# Patient Record
Sex: Female | Born: 1975
Health system: Southern US, Community
[De-identification: ages and names within clinical notes are randomized; demographics above are authoritative.]

---

## 1998-05-10 ENCOUNTER — Encounter: Payer: Self-pay | Admitting: Preventative Medicine

## 1998-05-10 ENCOUNTER — Ambulatory Visit (HOSPITAL_COMMUNITY): Admission: RE | Admit: 1998-05-10 | Discharge: 1998-05-10 | Payer: Self-pay | Admitting: Preventative Medicine

## 1998-05-24 ENCOUNTER — Ambulatory Visit (HOSPITAL_COMMUNITY): Admission: RE | Admit: 1998-05-24 | Discharge: 1998-05-24 | Payer: Self-pay | Admitting: Preventative Medicine

## 1998-05-24 ENCOUNTER — Encounter: Payer: Self-pay | Admitting: Preventative Medicine

## 1998-06-19 ENCOUNTER — Ambulatory Visit (HOSPITAL_COMMUNITY): Admission: RE | Admit: 1998-06-19 | Discharge: 1998-06-19 | Payer: Self-pay | Admitting: Preventative Medicine

## 1998-06-19 ENCOUNTER — Encounter: Payer: Self-pay | Admitting: Preventative Medicine

## 2004-12-28 ENCOUNTER — Emergency Department (HOSPITAL_COMMUNITY): Admission: EM | Admit: 2004-12-28 | Discharge: 2004-12-28 | Payer: Self-pay | Admitting: Emergency Medicine

## 2005-08-14 ENCOUNTER — Ambulatory Visit: Payer: Self-pay | Admitting: Gastroenterology

## 2006-08-28 ENCOUNTER — Ambulatory Visit (HOSPITAL_COMMUNITY): Admission: RE | Admit: 2006-08-28 | Discharge: 2006-08-28 | Payer: Self-pay | Admitting: Internal Medicine

## 2008-06-03 ENCOUNTER — Ambulatory Visit (HOSPITAL_COMMUNITY): Admission: RE | Admit: 2008-06-03 | Discharge: 2008-06-03 | Payer: Self-pay | Admitting: Family Medicine

## 2010-06-01 NOTE — Consult Note (Signed)
NAME:  Valerie Lowe, Valerie Lowe             ACCOUNT NO.:  [id number]   MEDICAL RECORD NO.:  [id number]           PATIENT TYPE:   LOCATION:                                 FACILITY:   PHYSICIAN:  Kassie Mends, M.D.      DATE OF BIRTH:  11/30/1975   DATE OF CONSULTATION:  DATE OF DISCHARGE:                                   CONSULTATION   REQUESTING PHYSICIAN:  Dr. Phillips Odor   REASON FOR CONSULTATION:  Refractory GERD, chronic nausea, dyspepsia.   HISTORY OF PRESENT ILLNESS:  Valerie Lowe is a 35 year old African-American  female.  She states approximately 11 months ago she was started on  prednisone for a rash.  She began to develop severe odynophagia as well as  chronic nausea, heartburn, indigestion.  The prednisone was then  discontinued.  She was started on PPI.  She continued to complain of  grinding sensation to her epigastrium and chest.  She continues to  complain of almost daily nausea without any vomiting.  She has occasional  intermittent dysphagia to solid foods.  She complains of almost daily  indigestion.  She denies any anorexia or early satiety.  She has been on  Aciphex 20 mg daily which has helped some, but not completely.  She  continues to have bowel movements on a daily basis.  She has noticed some  dark stools.   PAST MEDICAL HISTORY:  Tubal pregnancy with her right tube removed.   CURRENT MEDICATIONS:  1. Aciphex 20 mg daily.  2. Biotin once daily.  3. Multivitamin once daily.   ALLERGIES:  No known drug allergies.   FAMILY HISTORY:  There is no known family history of colorectal carcinoma,  liver or chronic GI problems.  Mother age 72, has history of hypertension.  Father age 56, is in good health.  She has three healthy siblings.   SOCIAL HISTORY:  Ms. Savitt has been married for nine years.  She has two  healthy children.  She works for [employer] in [place].  She denies  any tobacco, alcohol, or drug use.   REVIEW OF SYSTEMS:  CONSTITUTIONAL:  Weight  is stable.  She does complain of  some fatigue.  CARDIOVASCULAR:  Denies chest pain or palpitations.  PULMONARY:  Denies shortness of breath, dyspnea, cough, hemoptysis.  GI:  See HPI.   PHYSICAL EXAMINATION:  VITAL SIGNS:  Weight 135.5 pounds, height 67 inches,  temperature 98.8, blood pressure 110/62, and pulse 88.  GENERAL:  Valerie Lowe is a 35 year old African-American female who is alert,  oriented, pleasant, cooperative, in no acute distress.HEENT:  Sclerae clear,  non-icteric.  Conjunctivae pink.  Oropharynx pink and moist without any  lesions.NECK:  Supple without any mass, thyromegaly.CHEST:  Regular rate and  rhythm.  Normal S1, S2 without murmurs, rubs, clicks, or gallops.LUNGS:  Clear to auscultation bilaterally.ABDOMEN:  Positive bowel sounds x4.  No  bruits auscultated, soft, nondistended.  She does have mild tenderness to  the epigastrium and no Murphy's point tenderness, rebound tenderness, or  guarding.  No hepatosplenomegaly or mass. RECTAL:  Deferred.EXTREMITIES:  Without clubbing or  edema bilaterally.   IMPRESSION:  Valerie Lowe is a 35 year old African-American female with an 63-  month history of refractory gastroesophageal reflux disease symptoms  including daily heartburn, indigestion.  She has noticed odynophagia and  constant upper abdominal discomfort and a grinding type chest pain.  She  also has chronic nausea.  Her symptoms are going to need to be further  evaluated with EGD to rule out erosive esophagitis/gastritis.  Other  possibilities include gallbladder etiology which can be looked into further  if EGD is negative.   PLAN:  EGD with Dr. Cira Servant in the near future.  I have discussed the  procedure including risks and benefits which include, but not limited to,  bleeding, infection, perforation, drug reaction.  She agrees with plan.  She  is going to continue Aciphex 20 mg daily for now with further  recommendations pending procedure.   I would like to  thank Dr. Phillips Odor for allowing [place] to participate in the care  of Valerie Lowe.      Nicholas Lose, N.P.      Kassie Mends, M.D.  Electronically Signed    [facility]  D:  08/14/2005  T:  08/14/2005  Job:  [id number]   cc:   Corrie Mckusick, M.D.  Fax: [phone]

## 2012-02-18 ENCOUNTER — Other Ambulatory Visit (HOSPITAL_COMMUNITY): Payer: Self-pay | Admitting: Family Medicine

## 2012-02-18 DIAGNOSIS — Z139 Encounter for screening, unspecified: Secondary | ICD-10-CM

## 2012-02-24 ENCOUNTER — Ambulatory Visit (HOSPITAL_COMMUNITY): Payer: Self-pay

## 2012-02-28 ENCOUNTER — Ambulatory Visit (HOSPITAL_COMMUNITY): Payer: Self-pay

## 2013-10-15 ENCOUNTER — Other Ambulatory Visit (HOSPITAL_COMMUNITY): Payer: Self-pay | Admitting: Family Medicine

## 2013-10-15 DIAGNOSIS — Z0000 Encounter for general adult medical examination without abnormal findings: Secondary | ICD-10-CM

## 2013-10-25 ENCOUNTER — Ambulatory Visit (HOSPITAL_COMMUNITY): Payer: Self-pay

## 2013-10-25 ENCOUNTER — Ambulatory Visit (HOSPITAL_COMMUNITY)
Admission: RE | Admit: 2013-10-25 | Discharge: 2013-10-25 | Disposition: A | Discharge status: Home / Self Care | Payer: Private Health Insurance - Indemnity | Source: Ambulatory Visit | Attending: Family Medicine | Admitting: Family Medicine

## 2013-10-25 DIAGNOSIS — Z1231 Encounter for screening mammogram for malignant neoplasm of breast: Secondary | ICD-10-CM | POA: Diagnosis not present

## 2013-10-25 DIAGNOSIS — Z0000 Encounter for general adult medical examination without abnormal findings: Secondary | ICD-10-CM

## 2014-08-03 ENCOUNTER — Other Ambulatory Visit (HOSPITAL_COMMUNITY): Payer: Self-pay | Admitting: Family Medicine

## 2014-08-03 DIAGNOSIS — Z1231 Encounter for screening mammogram for malignant neoplasm of breast: Secondary | ICD-10-CM

## 2014-10-27 ENCOUNTER — Ambulatory Visit (HOSPITAL_COMMUNITY)
Admission: RE | Admit: 2014-10-27 | Discharge: 2014-10-27 | Disposition: A | Discharge status: Home / Self Care | Payer: BLUE CROSS/BLUE SHIELD | Source: Ambulatory Visit | Attending: Family Medicine | Admitting: Family Medicine

## 2014-10-27 DIAGNOSIS — Z1231 Encounter for screening mammogram for malignant neoplasm of breast: Secondary | ICD-10-CM | POA: Diagnosis not present

## 2015-10-17 ENCOUNTER — Other Ambulatory Visit (HOSPITAL_COMMUNITY): Payer: Self-pay | Admitting: Family Medicine

## 2015-10-17 DIAGNOSIS — Z1231 Encounter for screening mammogram for malignant neoplasm of breast: Secondary | ICD-10-CM

## 2015-11-09 ENCOUNTER — Ambulatory Visit (HOSPITAL_COMMUNITY)
Admission: RE | Admit: 2015-11-09 | Discharge: 2015-11-09 | Disposition: A | Discharge status: Home / Self Care | Payer: BLUE CROSS/BLUE SHIELD | Source: Ambulatory Visit | Attending: Family Medicine | Admitting: Family Medicine

## 2015-11-09 DIAGNOSIS — Z1231 Encounter for screening mammogram for malignant neoplasm of breast: Secondary | ICD-10-CM | POA: Diagnosis not present

## 2016-09-13 ENCOUNTER — Other Ambulatory Visit (HOSPITAL_COMMUNITY): Payer: Self-pay | Admitting: Family Medicine

## 2016-09-13 DIAGNOSIS — Z1231 Encounter for screening mammogram for malignant neoplasm of breast: Secondary | ICD-10-CM

## 2016-11-11 ENCOUNTER — Encounter (HOSPITAL_COMMUNITY): Payer: Self-pay

## 2016-11-11 ENCOUNTER — Ambulatory Visit (HOSPITAL_COMMUNITY)
Admission: RE | Admit: 2016-11-11 | Discharge: 2016-11-11 | Disposition: A | Discharge status: Home / Self Care | Payer: 59 | Source: Ambulatory Visit | Attending: Family Medicine | Admitting: Family Medicine

## 2016-11-11 DIAGNOSIS — Z1231 Encounter for screening mammogram for malignant neoplasm of breast: Secondary | ICD-10-CM | POA: Insufficient documentation

## 2017-07-26 DIAGNOSIS — S238XXA Sprain of other specified parts of thorax, initial encounter: Secondary | ICD-10-CM | POA: Diagnosis not present

## 2017-07-26 DIAGNOSIS — S134XXA Sprain of ligaments of cervical spine, initial encounter: Secondary | ICD-10-CM | POA: Diagnosis not present

## 2017-08-06 DIAGNOSIS — Z6826 Body mass index (BMI) 26.0-26.9, adult: Secondary | ICD-10-CM | POA: Diagnosis not present

## 2017-08-06 DIAGNOSIS — E663 Overweight: Secondary | ICD-10-CM | POA: Diagnosis not present

## 2017-08-06 DIAGNOSIS — M545 Low back pain: Secondary | ICD-10-CM | POA: Diagnosis not present

## 2017-10-03 DIAGNOSIS — Z0000 Encounter for general adult medical examination without abnormal findings: Secondary | ICD-10-CM | POA: Diagnosis not present

## 2017-10-27 ENCOUNTER — Other Ambulatory Visit (HOSPITAL_COMMUNITY): Payer: Self-pay | Admitting: Family Medicine

## 2017-10-27 DIAGNOSIS — Z1231 Encounter for screening mammogram for malignant neoplasm of breast: Secondary | ICD-10-CM

## 2017-11-14 ENCOUNTER — Ambulatory Visit (HOSPITAL_COMMUNITY)
Admission: RE | Admit: 2017-11-14 | Discharge: 2017-11-14 | Disposition: A | Discharge status: Home / Self Care | Payer: 59 | Source: Ambulatory Visit | Attending: Family Medicine | Admitting: Family Medicine

## 2017-11-14 DIAGNOSIS — Z1231 Encounter for screening mammogram for malignant neoplasm of breast: Secondary | ICD-10-CM | POA: Diagnosis present

## 2017-12-12 DIAGNOSIS — J209 Acute bronchitis, unspecified: Secondary | ICD-10-CM | POA: Diagnosis not present

## 2020-01-03 ENCOUNTER — Other Ambulatory Visit: Payer: Self-pay

## 2020-01-28 ENCOUNTER — Ambulatory Visit: Payer: 59 | Admitting: Cardiovascular Disease

## 2020-06-20 IMAGING — MG DIGITAL SCREENING BILATERAL MAMMOGRAM WITH TOMO AND CAD
8 series · 9 of 24 positions shown · non-contrast
Comparison: Previous exam(s).

CLINICAL DATA: Screening.

EXAM:
DIGITAL SCREENING BILATERAL MAMMOGRAM WITH TOMO AND CAD

[L MLO synth-2D]
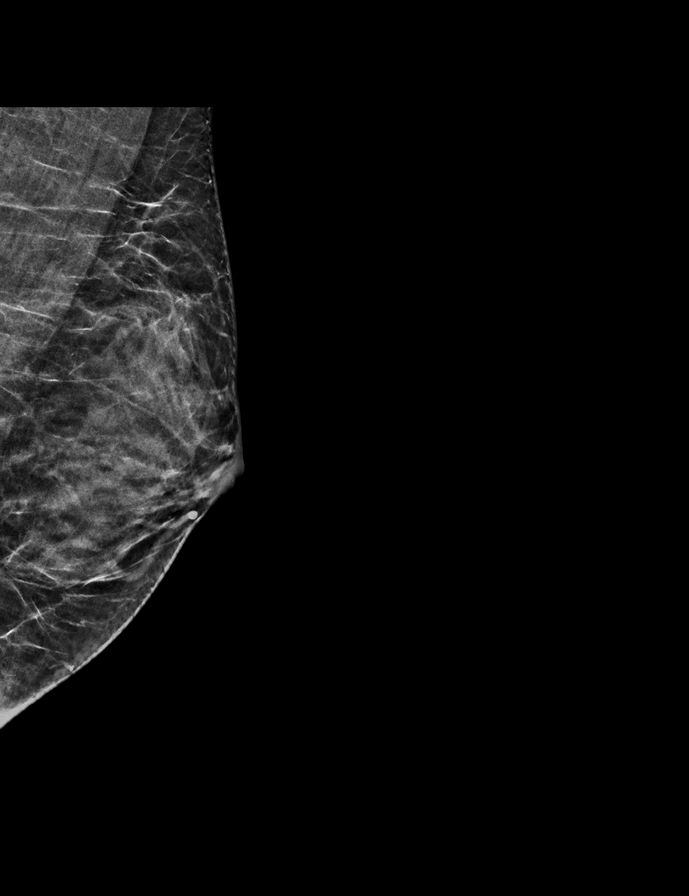

[R MLO synth-2D]
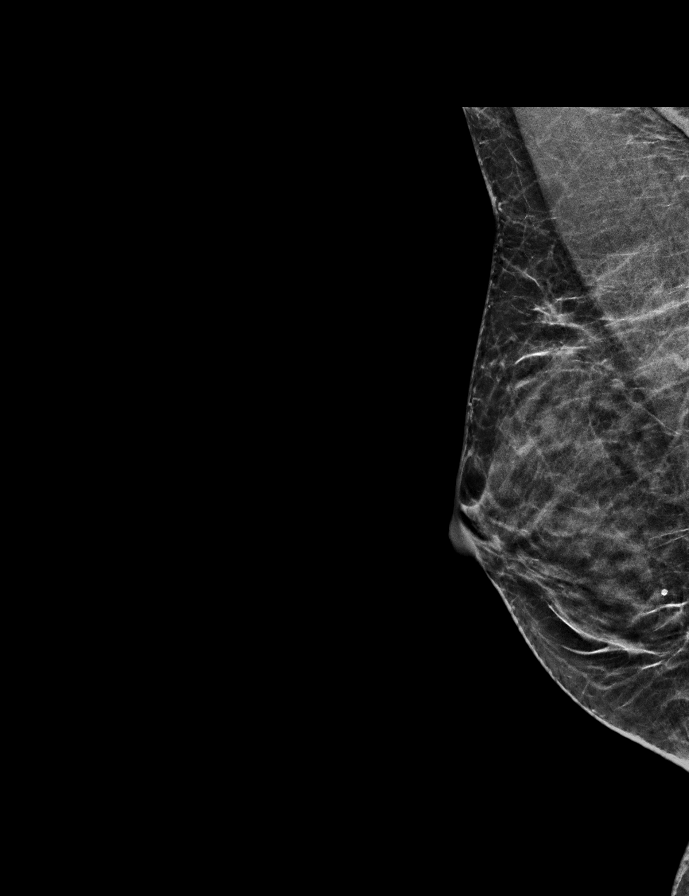

[R CC synth-2D]
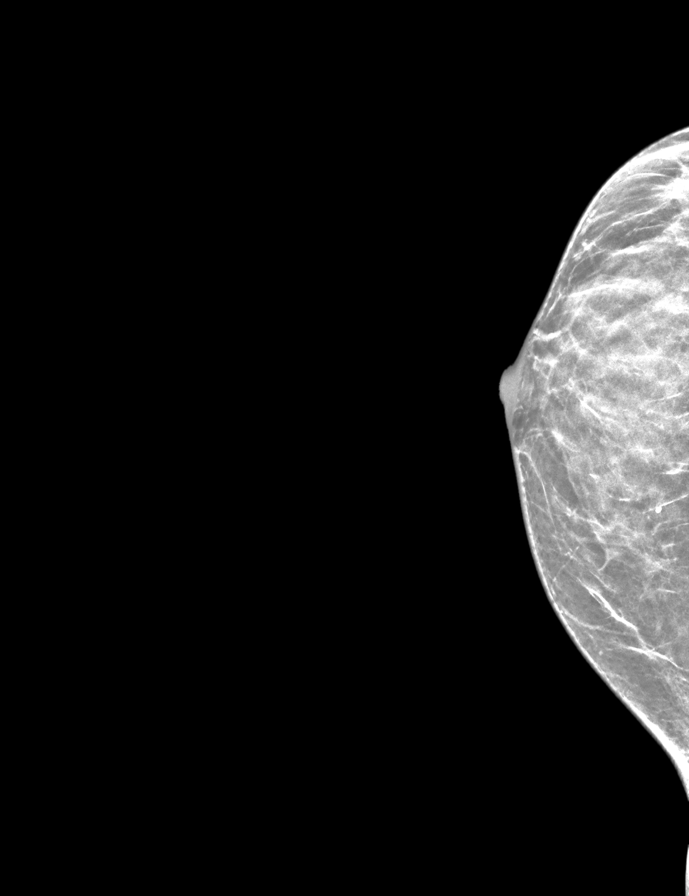

[L CC synth-2D]
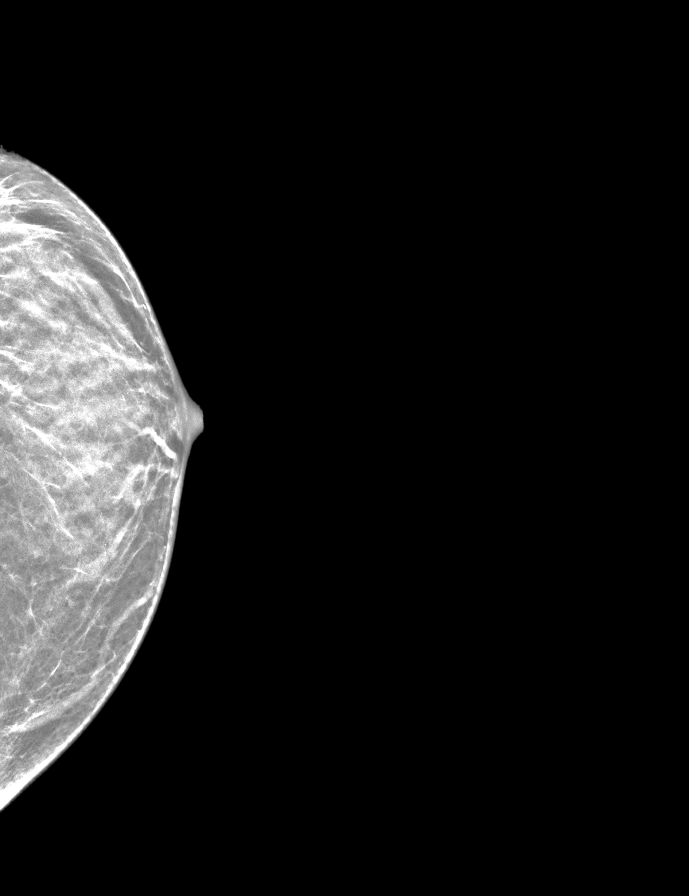

[R CC tomo · 2 of 41 frames shown]
[frame 14/41]
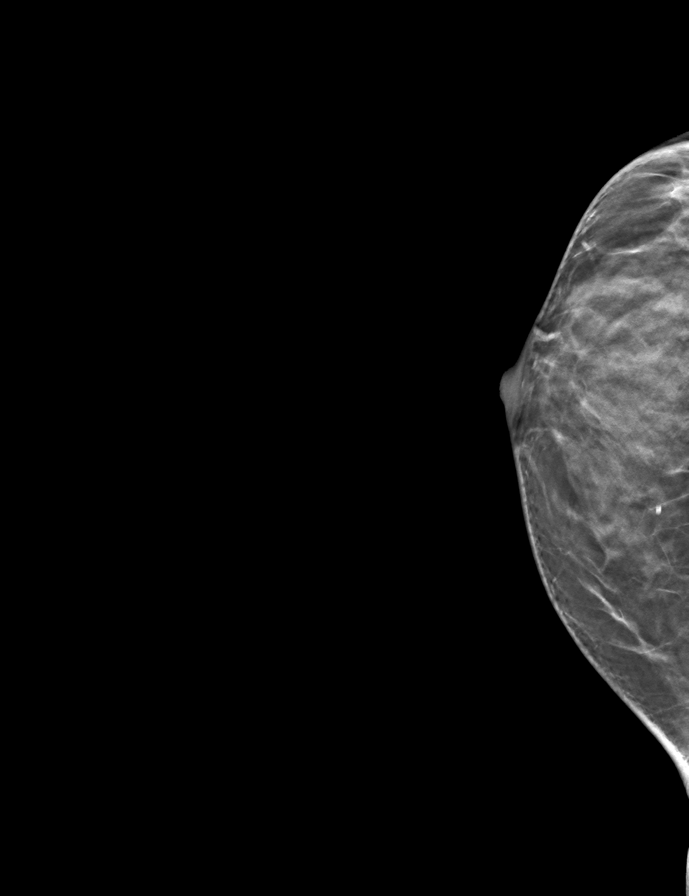
[frame 21/41]
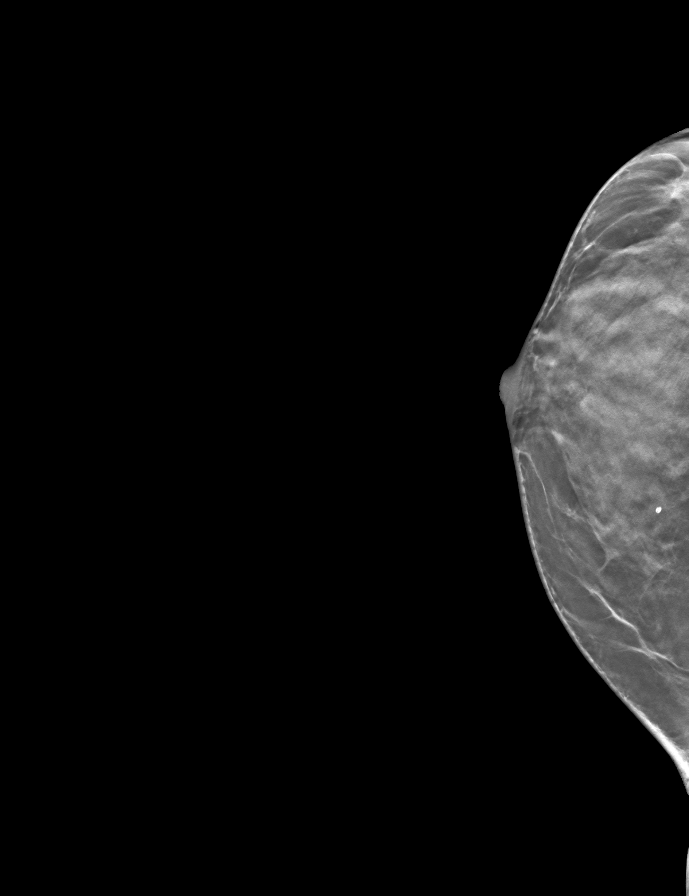

[L MLO tomo · tomo slice 21/42.0]
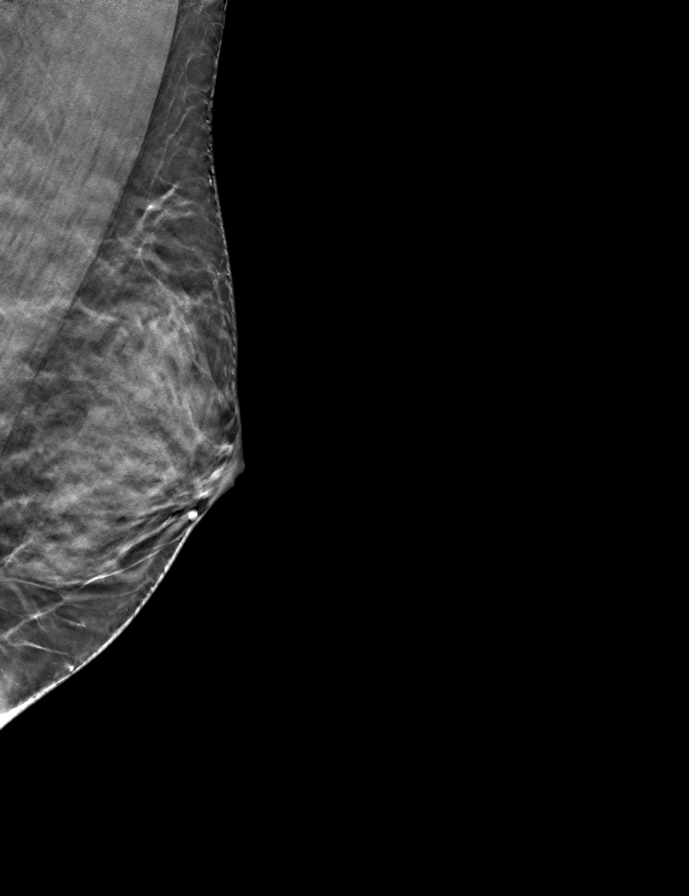

[R MLO tomo · tomo slice 23/44.0]
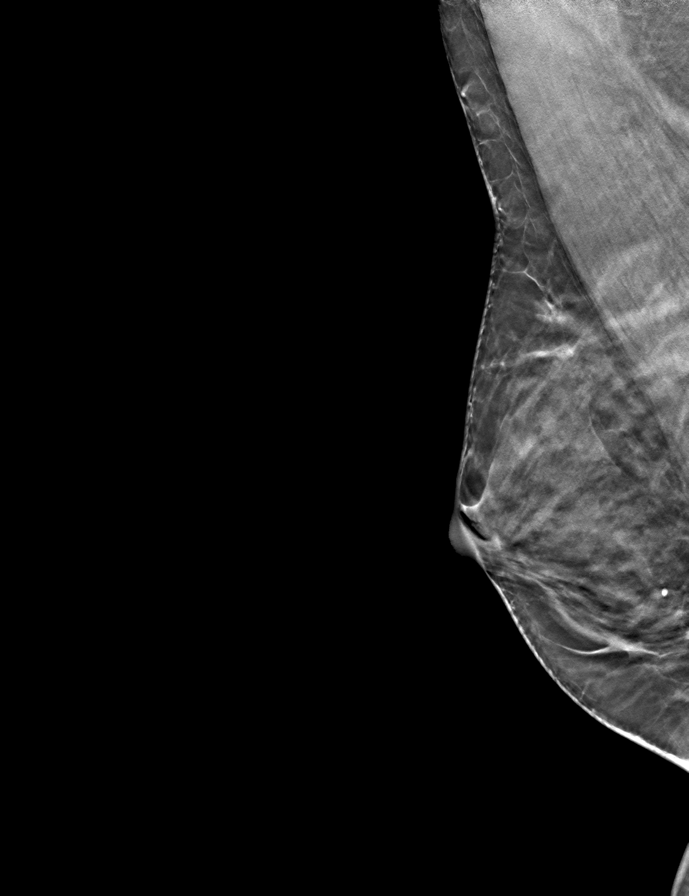

[L CC tomo · tomo slice 21/40.0]
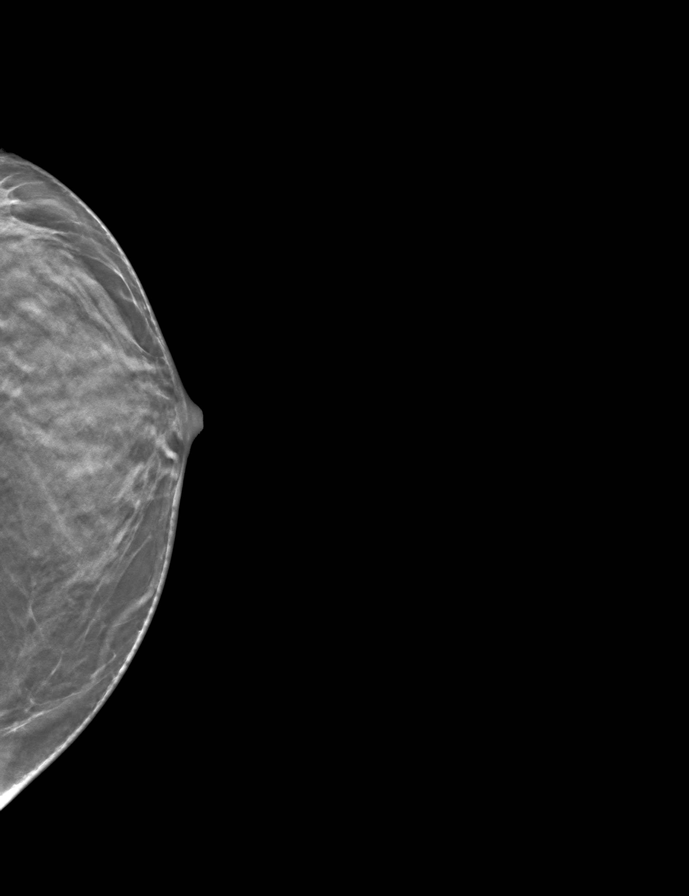

[9 of 24 positions shown; findings below may reference images not displayed]

ACR Breast Density Category d: The breast tissue is extremely dense,
which lowers the sensitivity of mammography.
FINDINGS: There are no findings suspicious for malignancy. Images were
processed with CAD.
IMPRESSION: No mammographic evidence of malignancy. A result letter of this
screening mammogram will be mailed directly to the patient.

RECOMMENDATION:
Screening mammogram in one year. (Code:RA-I-AVB)

BI-RADS CATEGORY  1: Negative.

## 2021-02-09 ENCOUNTER — Other Ambulatory Visit: Payer: Self-pay | Admitting: Family Medicine

## 2021-02-09 ENCOUNTER — Other Ambulatory Visit (HOSPITAL_COMMUNITY): Payer: Self-pay | Admitting: Family Medicine

## 2021-02-09 DIAGNOSIS — R0989 Other specified symptoms and signs involving the circulatory and respiratory systems: Secondary | ICD-10-CM

## 2021-02-12 ENCOUNTER — Other Ambulatory Visit (HOSPITAL_COMMUNITY): Payer: Self-pay | Admitting: Family Medicine

## 2021-02-12 DIAGNOSIS — Z1231 Encounter for screening mammogram for malignant neoplasm of breast: Secondary | ICD-10-CM

## 2021-02-26 ENCOUNTER — Other Ambulatory Visit: Payer: Self-pay

## 2021-02-26 ENCOUNTER — Ambulatory Visit (HOSPITAL_COMMUNITY)
Admission: RE | Admit: 2021-02-26 | Discharge: 2021-02-26 | Disposition: A | Discharge status: Home / Self Care | Payer: 59 | Source: Ambulatory Visit | Attending: Family Medicine | Admitting: Family Medicine

## 2021-02-26 DIAGNOSIS — Z1231 Encounter for screening mammogram for malignant neoplasm of breast: Secondary | ICD-10-CM | POA: Insufficient documentation

## 2021-03-14 ENCOUNTER — Other Ambulatory Visit (HOSPITAL_COMMUNITY): Payer: Self-pay | Admitting: Family Medicine

## 2021-03-14 DIAGNOSIS — Z1231 Encounter for screening mammogram for malignant neoplasm of breast: Secondary | ICD-10-CM

## 2022-03-12 ENCOUNTER — Other Ambulatory Visit (HOSPITAL_COMMUNITY): Payer: Self-pay | Admitting: Family Medicine

## 2022-03-12 DIAGNOSIS — Z1231 Encounter for screening mammogram for malignant neoplasm of breast: Secondary | ICD-10-CM

## 2022-03-13 ENCOUNTER — Ambulatory Visit (HOSPITAL_COMMUNITY)
Admission: RE | Admit: 2022-03-13 | Discharge: 2022-03-13 | Disposition: A | Discharge status: Home / Self Care | Payer: 59 | Source: Ambulatory Visit | Attending: Family Medicine | Admitting: Family Medicine

## 2022-03-13 ENCOUNTER — Encounter (HOSPITAL_COMMUNITY): Payer: Self-pay

## 2022-03-13 ENCOUNTER — Inpatient Hospital Stay (HOSPITAL_COMMUNITY): Admission: RE | Admit: 2022-03-13 | Payer: 59 | Source: Ambulatory Visit

## 2022-03-13 DIAGNOSIS — Z1231 Encounter for screening mammogram for malignant neoplasm of breast: Secondary | ICD-10-CM | POA: Diagnosis present

## 2023-04-16 ENCOUNTER — Other Ambulatory Visit (HOSPITAL_COMMUNITY): Payer: Self-pay | Admitting: Family Medicine

## 2023-04-16 DIAGNOSIS — Z1231 Encounter for screening mammogram for malignant neoplasm of breast: Secondary | ICD-10-CM

## 2023-04-30 ENCOUNTER — Ambulatory Visit (HOSPITAL_COMMUNITY)
Admission: RE | Admit: 2023-04-30 | Discharge: 2023-04-30 | Disposition: A | Discharge status: Home / Self Care | Source: Ambulatory Visit | Attending: Family Medicine | Admitting: Family Medicine

## 2023-04-30 ENCOUNTER — Encounter (HOSPITAL_COMMUNITY): Payer: Self-pay

## 2023-04-30 DIAGNOSIS — Z1231 Encounter for screening mammogram for malignant neoplasm of breast: Secondary | ICD-10-CM | POA: Diagnosis present

## 2023-10-14 ENCOUNTER — Encounter: Admitting: Adult Health

## 2024-01-22 ENCOUNTER — Encounter: Admitting: Adult Health
